# Patient Record
Sex: Female | Born: 1975 | Race: Black or African American | Hispanic: No | Marital: Single | Smoking: Never smoker
Health system: Southern US, Community
[De-identification: ages and names within clinical notes are randomized; demographics above are authoritative.]

## PROBLEM LIST (undated history)

## (undated) DIAGNOSIS — E119 Type 2 diabetes mellitus without complications: Secondary | ICD-10-CM

## (undated) DIAGNOSIS — Z9889 Other specified postprocedural states: Secondary | ICD-10-CM

## (undated) DIAGNOSIS — D649 Anemia, unspecified: Secondary | ICD-10-CM

## (undated) DIAGNOSIS — I10 Essential (primary) hypertension: Secondary | ICD-10-CM

## (undated) DIAGNOSIS — N946 Dysmenorrhea, unspecified: Secondary | ICD-10-CM

## (undated) DIAGNOSIS — N920 Excessive and frequent menstruation with regular cycle: Secondary | ICD-10-CM

## (undated) DIAGNOSIS — D573 Sickle-cell trait: Secondary | ICD-10-CM

## (undated) DIAGNOSIS — E7800 Pure hypercholesterolemia, unspecified: Secondary | ICD-10-CM

## (undated) HISTORY — DX: Dysmenorrhea, unspecified: N94.6

## (undated) HISTORY — DX: Excessive and frequent menstruation with regular cycle: N92.0

## (undated) HISTORY — PX: HERNIA REPAIR: SHX51

## (undated) HISTORY — PX: NECK SURGERY: SHX720

## (undated) HISTORY — DX: Sickle-cell trait: D57.3

## (undated) HISTORY — DX: Anemia, unspecified: D64.9

---

## 2006-04-22 ENCOUNTER — Emergency Department (HOSPITAL_COMMUNITY): Admission: EM | Admit: 2006-04-22 | Discharge: 2006-04-22 | Payer: Self-pay | Admitting: Emergency Medicine

## 2009-10-02 ENCOUNTER — Emergency Department (HOSPITAL_COMMUNITY): Admission: EM | Admit: 2009-10-02 | Discharge: 2009-10-02 | Payer: Self-pay | Admitting: Emergency Medicine

## 2012-09-20 ENCOUNTER — Emergency Department (INDEPENDENT_AMBULATORY_CARE_PROVIDER_SITE_OTHER)
Admission: EM | Admit: 2012-09-20 | Discharge: 2012-09-20 | Disposition: A | Discharge status: Home / Self Care | Payer: PRIVATE HEALTH INSURANCE | Source: Home / Self Care

## 2012-09-20 ENCOUNTER — Encounter (HOSPITAL_COMMUNITY): Payer: Self-pay | Admitting: Emergency Medicine

## 2012-09-20 DIAGNOSIS — L0231 Cutaneous abscess of buttock: Secondary | ICD-10-CM

## 2012-09-20 DIAGNOSIS — L03317 Cellulitis of buttock: Secondary | ICD-10-CM

## 2012-09-20 HISTORY — DX: Other specified postprocedural states: Z98.890

## 2012-09-20 MED ORDER — SULFAMETHOXAZOLE-TRIMETHOPRIM 800-160 MG PO TABS: 1.0000 | ORAL_TABLET | Freq: Two times a day (BID) | ORAL | Status: DC

## 2012-09-20 NOTE — ED Provider Notes (Signed)
Medical screening examination/treatment/procedure(s) were performed by non-physician practitioner and as supervising physician I was immediately available for consultation/collaboration.  Raynald Blend, MD 09/20/12 2227

## 2012-09-20 NOTE — ED Provider Notes (Signed)
History     CSN: [id number]  Arrival date & time 09/20/12  1306   None     Chief Complaint  Patient presents with  . Abscess    (Consider location/radiation/quality/duration/timing/severity/associated sxs/prior treatment) HPI Comments: Presents with a complaint of a sore about on the lower back. 48 hours ago developed a small pimple-like lesion in the upper gluteal cleft. Over the ensuing 48 hours it has increased in size and has become quite tender. There is no evidence of drainage. She had been applying heat and that seemed to help some area denies systemic symptoms  Patient is a 36 y.o. female presenting with abscess. The history is provided by the patient.  Abscess  This is a new problem. The onset was gradual. The problem has been gradually worsening. The problem is moderate. The abscess is characterized by painfulness. Pertinent negatives include no decrease in physical activity, not sleeping less, no fever and no vomiting.    Past Medical History  Diagnosis Date  . H/O neck surgery     Past Surgical History  Procedure Date  . Neck surgery   . Hernia repair     No family history on file.  History  Substance Use Topics  . Smoking status: Never Smoker   . Smokeless tobacco: Not on file  . Alcohol Use: Yes    OB History    Grav Para Term Preterm Abortions TAB SAB Ect Mult Living                  Review of Systems  Constitutional: Negative for fever.  Respiratory: Negative.   Gastrointestinal: Negative.  Negative for vomiting.  Musculoskeletal: Negative.   Skin:       As noted in history of present illness  Neurological: Negative.     Allergies  Review of patient's allergies indicates no known allergies.  Home Medications   Current Outpatient Rx  Name Route Sig Dispense Refill  . SULFAMETHOXAZOLE-TRIMETHOPRIM 800-160 MG PO TABS Oral Take 1 tablet by mouth 2 (two) times daily. 16 tablet 0    BP 131/85  Pulse 73  Temp 98.5 F (36.9 C) (Oral)   Resp 16  SpO2 100%  LMP 09/20/2012  Physical Exam  Constitutional: She is oriented to person, place, and time. She appears well-developed and well-nourished. No distress.  HENT:  Head: Normocephalic and atraumatic.  Eyes: EOM are normal. Pupils are equal, round, and reactive to light.  Neck: Normal range of motion. Neck supple.  Musculoskeletal: Normal range of motion. She exhibits no edema.  Lymphadenopathy:    She has no cervical adenopathy.  Neurological: She is alert and oriented to person, place, and time. No cranial nerve deficit.  Skin: Skin is warm and dry.       There is just a 3 cm slightly raised mounded exquisitely tender lesion in the upper gluteal cleft. There is no pustule no apparent fluctuance and no drainage. No lymphangitis. No redness in the buttocks.    ED Course  Procedures (including critical care time)  Labs Reviewed - No data to display No results found.   1. Cellulitis and abscess of buttock       MDM  Differential includes early abscess formation or possible early pilonidal cyst infection.  Septra DS bid for 8 d   Plenty of water daily Heat to the sore area. Do not sit directly on the lesion.          Hayden Rasmussen, NP 09/20/12 1444  Hayden Rasmussen,  NP 09/20/12 1445

## 2012-09-20 NOTE — ED Notes (Signed)
Reports bump on buttocks per patient.  Noticed Friday and has used warm compresses.  No history of this in the past

## 2013-04-20 ENCOUNTER — Encounter: Payer: Self-pay | Admitting: Certified Nurse Midwife

## 2013-04-21 ENCOUNTER — Encounter: Payer: Self-pay | Admitting: Certified Nurse Midwife

## 2013-04-21 ENCOUNTER — Ambulatory Visit: Payer: Self-pay | Admitting: Certified Nurse Midwife

## 2013-04-21 DIAGNOSIS — Z01419 Encounter for gynecological examination (general) (routine) without abnormal findings: Secondary | ICD-10-CM

## 2013-05-06 ENCOUNTER — Ambulatory Visit (INDEPENDENT_AMBULATORY_CARE_PROVIDER_SITE_OTHER): Payer: 59 | Admitting: Obstetrics and Gynecology

## 2013-05-06 VITALS — BP 128/80

## 2013-05-06 DIAGNOSIS — Z3041 Encounter for surveillance of contraceptive pills: Secondary | ICD-10-CM

## 2013-05-06 NOTE — Progress Notes (Signed)
Patient ID: Tina Vang, female   DOB: July 07, 1976, 37 y.o.   MRN: [id number] Patient presents today for blood pressure check per Ria Comment, NP patient stated she was wanting to switch from the Nuva Ring to an oral contraceptive and last reading was elevated and she was instructed to return for a recheck.    Reading today was 128/80  She will wait for a call for further instruction.

## 2013-05-06 NOTE — Progress Notes (Signed)
Tiffany did you see note for her Nuva Ring? She will need refill on this not OCP

## 2013-05-06 NOTE — Progress Notes (Signed)
Patient wanted to try Nuva ring and was given 3 samples at last visit, (stated doesn't want depo or OCP).  So this was just a BP recheck.  Other note says she wanted to change to a pill.  Is this correct info????

## 2013-05-06 NOTE — Progress Notes (Signed)
BP normal.  Chart routed to Ria Comment for further input.

## 2013-06-29 ENCOUNTER — Telehealth: Payer: Self-pay | Admitting: Nurse Practitioner

## 2013-06-29 NOTE — Telephone Encounter (Signed)
Left message on CB# of need to call office for more information. sue

## 2013-06-29 NOTE — Telephone Encounter (Signed)
Patient wants to start birth control pills but never has taken them before. Was on Nuvaring but wants to try oral contraceptives instead. [employer] [facility] Out[facility] Pharmacy

## 2013-06-30 NOTE — Telephone Encounter (Signed)
Patient called. Stated she reqeust to go on birth control pills. Does not like Nuvaring. Stated did use samples x 2 month and skipped a month and is trying it again and cycle is irregular now and is requesting to go on pills for birth control. Please advise.

## 2013-06-30 NOTE — Telephone Encounter (Signed)
Left message on CB# of need to call office for more information concerning birth control.

## 2013-07-01 MED ORDER — NORETHINDRONE ACET-ETHINYL EST 1-20 MG-MCG PO TABS: 1.0000 | ORAL_TABLET | Freq: Every day | ORAL | Status: DC

## 2013-07-01 NOTE — Telephone Encounter (Signed)
Patient may try OCP. Since history of menorrhagia will try on Loestrin 1/20 for 3 months - no refill until BP check.  Please review start date after menses, side effects and BUM for a month.  Will will need to recheck BP on the third pack of OCP to make sure it did not cause an elevation.  We would have checked on the third month of Nuva Ring but she went off on her own.

## 2013-07-01 NOTE — Telephone Encounter (Signed)
Patient notified of patty's instructions for OCP.  LMP 06-29-13 so instructed to begin OCP pack this Sunday and take one Q day.  For BP check in 3 pack while on active pills.  Patient to call for appointment when starts 3rd pack.  Stressed that BTB is very common side effect first 3 months and she should continue pills daily regardless of bleeding.  Can not rely on this for contraception first cycle and should use BUM. Rx to [facility] pt pharm

## 2014-01-24 ENCOUNTER — Ambulatory Visit: Payer: 59 | Admitting: Nurse Practitioner

## 2014-02-08 ENCOUNTER — Ambulatory Visit: Payer: 59 | Admitting: Nurse Practitioner

## 2014-04-25 ENCOUNTER — Ambulatory Visit: Payer: 59 | Admitting: Nurse Practitioner

## 2014-07-08 ENCOUNTER — Encounter: Payer: Self-pay | Admitting: Nurse Practitioner

## 2014-07-08 ENCOUNTER — Ambulatory Visit (INDEPENDENT_AMBULATORY_CARE_PROVIDER_SITE_OTHER): Payer: 59 | Admitting: Nurse Practitioner

## 2014-07-08 VITALS — BP 132/88 | HR 72 | Ht 64.25 in | Wt 188.0 lb

## 2014-07-08 DIAGNOSIS — Z0000 Encounter for general adult medical examination without abnormal findings: Secondary | ICD-10-CM

## 2014-07-08 DIAGNOSIS — Z01419 Encounter for gynecological examination (general) (routine) without abnormal findings: Secondary | ICD-10-CM

## 2014-07-08 DIAGNOSIS — Z975 Presence of (intrauterine) contraceptive device: Secondary | ICD-10-CM

## 2014-07-08 DIAGNOSIS — Z113 Encounter for screening for infections with a predominantly sexual mode of transmission: Secondary | ICD-10-CM

## 2014-07-08 LAB — HEMOGLOBIN, FINGERSTICK: Hemoglobin, fingerstick: 11.3 g/dL — ABNORMAL LOW (ref 12.0–16.0)

## 2014-07-08 NOTE — Progress Notes (Signed)
Patient ID: Tina Vang, female   DOB: [birth year], 38 y.o.   MRN: [id number] 38 y.o. G1P1001 Single African American Fe here for annual exam.  Had a new partner this past year.  Menses now at 7 days.  Heavy for 3 days.super tampon and pad changing 2 hours.  Having clots.  No cramps.  Does not like the Nuva Ring and OCP caused her 'gagging' and she was noncompliant.  She always uses condoms.  Interested in Mirena IUD.  Last year on the Nuva Ring she did have an elevaetd BP but returned to normal. She had PUS 10/24/2010 and no fibroids were found.     Patient's last menstrual period was 06/26/2014.          Sexually active: Yes.    The current method of family planning is condoms all of the time.    Exercising: No.  The patient does not participate in regular exercise at present. Smoker:  no  Health Maintenance: Pap:  01/21/13, neg, neg HR HPV TDaP:  2013 Labs: HB:  11.3  Urine:  Trace protein    reports that she has never smoked. She has never used smokeless tobacco. She reports that she drinks alcohol. She reports that she does not use illicit drugs.  Past Medical History  Diagnosis Date  . H/O neck surgery   . Dysmenorrhea   . Menorrhagia   . Sickle cell trait   . Anemia     chronic anemia    Past Surgical History  Procedure Laterality Date  . Neck surgery    . Hernia repair      No current outpatient prescriptions on file.   No current facility-administered medications for this visit.    Family History  Problem Relation Age of Onset  . Thyroid disease Mother   . Fibroids Mother   . Heart attack Father [id number]    ETOH and heart disease  . Fibroids Sister   . Thyroid cancer Sister   . Hypertension Brother   . Stroke Paternal Grandmother   . Fibroids Sister   . Cancer Paternal Grandfather     ROS:  Pertinent items are noted in HPI.  Otherwise, a comprehensive ROS was negative.  Exam:   BP 132/88  Pulse 72  Ht 5' 4.25" (1.632 m)  Wt 188 lb (85.276 kg)  BMI  32.02 kg/m2  LMP 06/26/2014 Height: 5' 4.25" (163.2 cm)  Ht Readings from Last 3 Encounters:  07/08/14 5' 4.25" (1.632 m)    General appearance: alert, cooperative and appears stated age Head: Normocephalic, without obvious abnormality, atraumatic Neck: no adenopathy, supple, symmetrical, trachea midline and thyroid normal to inspection and palpation Lungs: clear to auscultation bilaterally Breasts: normal appearance, no masses or tenderness Heart: regular rate and rhythm Abdomen: soft, non-tender; no masses,  no organomegaly Extremities: extremities normal, atraumatic, no cyanosis or edema Skin: Skin color, texture, turgor normal. No rashes or lesions Lymph nodes: Cervical, supraclavicular, and axillary nodes normal. No abnormal inguinal nodes palpated Neurologic: Grossly normal   Pelvic: External genitalia:  no lesions              Urethra:  normal appearing urethra with no masses, tenderness or lesions              Bartholin's and Skene's: normal                 Vagina: normal appearing vagina with normal color and discharge, no lesions  Cervix: anteverted              Pap taken: Yes.   Bimanual Exam:  Uterus:  normal size, contour, position, consistency, mobility, non-tender              Adnexa: no mass, fullness, tenderness               Rectovaginal: Confirms               Anus:  normal sphincter tone, no lesions  A:  Well Woman with normal exam  Menorrhagia with anemia  R/O STD's  Desires Mirena IUD  P:   Reviewed health and wellness pertinent to exam  Pap smear taken today  Follow with other STD's  Order is placed for Mirena IUD - she is aware of need for MD consult visit before IUD is placed.  Counseled on breast self exam, adequate intake of calcium and vitamin D, diet and exercise return annually or prn  An After Visit Summary was printed and given to the patient.

## 2014-07-08 NOTE — Patient Instructions (Addendum)
EXERCISE AND DIET:  We recommended that you start or continue a regular exercise program for good health. Regular exercise means any activity that makes your heart beat faster and makes you sweat.  We recommend exercising at least 30 minutes per day at least 3 days a week, preferably 4 or 5.  We also recommend a diet low in fat and sugar.  Inactivity, poor dietary choices and obesity can cause diabetes, heart attack, stroke, and kidney damage, among others.    ALCOHOL AND SMOKING:  Women should limit their alcohol intake to no more than 7 drinks/beers/glasses of wine (combined, not each!) per week. Moderation of alcohol intake to this level decreases your risk of breast cancer and liver damage. And of course, no recreational drugs are part of a healthy lifestyle.  And absolutely no smoking or even second hand smoke. Most people know smoking can cause heart and lung diseases, but did you know it also contributes to weakening of your bones? Aging of your skin?  Yellowing of your teeth and nails?  CALCIUM AND VITAMIN D:  Adequate intake of calcium and Vitamin D are recommended.  The recommendations for exact amounts of these supplements seem to change often, but generally speaking 600 mg of calcium (either carbonate or citrate) and 800 units of Vitamin D per day seems prudent. Certain women may benefit from higher intake of Vitamin D.  If you are among these women, your doctor will have told you during your visit.    PAP SMEARS:  Pap smears, to check for cervical cancer or precancers,  have traditionally been done yearly, although recent scientific advances have shown that most women can have pap smears less often.  However, every woman still should have a physical exam from her gynecologist every year. It will include a breast check, inspection of the vulva and vagina to check for abnormal growths or skin changes, a visual exam of the cervix, and then an exam to evaluate the size and shape of the uterus and  ovaries.  And after 38 years of age, a rectal exam is indicated to check for rectal cancers. We will also provide age appropriate advice regarding health maintenance, like when you should have certain vaccines, screening for sexually transmitted diseases, bone density testing, colonoscopy, mammograms, etc.   MAMMOGRAMS:  All women over 40 years old should have a yearly mammogram. Many facilities now offer a "3D" mammogram, which may cost around $50 extra out of pocket. If possible,  we recommend you accept the option to have the 3D mammogram performed.  It both reduces the number of women who will be called back for extra views which then turn out to be normal, and it is better than the routine mammogram at detecting truly abnormal areas.    COLONOSCOPY:  Colonoscopy to screen for colon cancer is recommended for all women at age 50.  We know, you hate the idea of the prep.  We agree, BUT, having colon cancer and not knowing it is worse!!  Colon cancer so often starts as a polyp that can be seen and removed at colonscopy, which can quite literally save your life!  And if your first colonoscopy is normal and you have no family history of colon cancer, most women don't have to have it again for 10 years.  Once every ten years, you can do something that may end up saving your life, right?  We will be happy to help you get it scheduled when you are ready.    Be sure to check your insurance coverage so you understand how much it will cost.  It may be covered as a preventative service at no cost, but you should check your particular policy.     Levonorgestrel intrauterine device (IUD) What is this medicine? LEVONORGESTREL IUD (LEE voe nor jes trel) is a contraceptive (birth control) device. The device is placed inside the uterus by a healthcare professional. It is used to prevent pregnancy and can also be used to treat heavy bleeding that occurs during your period. Depending on the device, it can be used for 3 to 5  years. This medicine may be used for other purposes; ask your health care provider or pharmacist if you have questions. COMMON BRAND NAME(S): Mirena, Skyla What should I tell my health care provider before I take this medicine? They need to know if you have any of these conditions: -abnormal Pap smear -cancer of the breast, uterus, or cervix -diabetes -endometritis -genital or pelvic infection now or in the past -have more than one sexual partner or your partner has more than one partner -heart disease -history of an ectopic or tubal pregnancy -immune system problems -IUD in place -liver disease or tumor -problems with blood clots or take blood-thinners -use intravenous drugs -uterus of unusual shape -vaginal bleeding that has not been explained -an unusual or allergic reaction to levonorgestrel, other hormones, silicone, or polyethylene, medicines, foods, dyes, or preservatives -pregnant or trying to get pregnant -breast-feeding How should I use this medicine? This device is placed inside the uterus by a health care professional. Talk to your pediatrician regarding the use of this medicine in children. Special care may be needed. Overdosage: If you think you have taken too much of this medicine contact a poison control center or emergency room at once. NOTE: This medicine is only for you. Do not share this medicine with others. What if I miss a dose? This does not apply. What may interact with this medicine? Do not take this medicine with any of the following medications: -amprenavir -bosentan -fosamprenavir This medicine may also interact with the following medications: -aprepitant -barbiturate medicines for inducing sleep or treating seizures -bexarotene -griseofulvin -medicines to treat seizures like carbamazepine, ethotoin, felbamate, oxcarbazepine, phenytoin, topiramate -modafinil -pioglitazone -rifabutin -rifampin -rifapentine -some medicines to treat HIV  infection like atazanavir, indinavir, lopinavir, nelfinavir, tipranavir, ritonavir -St. John's wort -warfarin This list may not describe all possible interactions. Give your health care provider a list of all the medicines, herbs, non-prescription drugs, or dietary supplements you use. Also tell them if you smoke, drink alcohol, or use illegal drugs. Some items may interact with your medicine. What should I watch for while using this medicine? Visit your doctor or health care professional for regular check ups. See your doctor if you or your partner has sexual contact with others, becomes HIV positive, or gets a sexual transmitted disease. This product does not protect you against HIV infection (AIDS) or other sexually transmitted diseases. You can check the placement of the IUD yourself by reaching up to the top of your vagina with clean fingers to feel the threads. Do not pull on the threads. It is a good habit to check placement after each menstrual period. Call your doctor right away if you feel more of the IUD than just the threads or if you cannot feel the threads at all. The IUD may come out by itself. You may become pregnant if the device comes out. If you notice that the IUD has   come out use a backup birth control method like condoms and call your health care provider. Using tampons will not change the position of the IUD and are okay to use during your period. What side effects may I notice from receiving this medicine? Side effects that you should report to your doctor or health care professional as soon as possible: -allergic reactions like skin rash, itching or hives, swelling of the face, lips, or tongue -fever, flu-like symptoms -genital sores -high blood pressure -no menstrual period for 6 weeks during use -pain, swelling, warmth in the leg -pelvic pain or tenderness -severe or sudden headache -signs of pregnancy -stomach cramping -sudden shortness of breath -trouble with  balance, talking, or walking -unusual vaginal bleeding, discharge -yellowing of the eyes or skin Side effects that usually do not require medical attention (report to your doctor or health care professional if they continue or are bothersome): -acne -breast pain -change in sex drive or performance -changes in weight -cramping, dizziness, or faintness while the device is being inserted -headache -irregular menstrual bleeding within first 3 to 6 months of use -nausea This list may not describe all possible side effects. Call your doctor for medical advice about side effects. You may report side effects to FDA at 1-800-FDA-1088. Where should I keep my medicine? This does not apply. NOTE: This sheet is a summary. It may not cover all possible information. If you have questions about this medicine, talk to your doctor, pharmacist, or health care provider.  2015, Elsevier/Gold Standard. (2012-01-16 13:54:04)  

## 2014-07-09 LAB — STD PANEL
HIV 1&2 Ab, 4th Generation: NONREACTIVE
Hepatitis B Surface Ag: NEGATIVE

## 2014-07-11 NOTE — Progress Notes (Signed)
Note reviewed, agree with plan.  Haakon Titsworth, MD  

## 2014-07-12 LAB — IPS PAP TEST WITH REFLEX TO HPV

## 2014-07-12 LAB — IPS N GONORRHOEA AND CHLAMYDIA BY PCR

## 2014-07-19 ENCOUNTER — Telehealth: Payer: Self-pay | Admitting: Obstetrics and Gynecology

## 2014-07-19 NOTE — Telephone Encounter (Signed)
Patient returning Sabrina's call. °

## 2014-07-19 NOTE — Telephone Encounter (Signed)
Left message for patient to call back. Need to go over IUD benefits °

## 2014-07-19 NOTE — Telephone Encounter (Signed)
Spoke with patient. Advised that per benefits quote received, IUD and insertion is covered at 100%. There will be 0 patient liability. Patient is to call within the first 5 days of her cycle to schedule insertion. °

## 2014-08-02 ENCOUNTER — Telehealth: Payer: Self-pay | Admitting: Nurse Practitioner

## 2014-08-02 NOTE — Telephone Encounter (Signed)
Left message to call Kaitlyn at [phone].  Needs to schedule consult with MD before scheduling insertion.

## 2014-08-02 NOTE — Telephone Encounter (Signed)
Patient is calling to let us know she started her cycle Saturday. Wants to schedule IUD insertion.

## 2014-08-03 NOTE — Telephone Encounter (Signed)
Spoke with patient. Advised patient that she will need MD consult before we can schedule IUD insertion. Offered patient first available appointment with Dr.Lathrop on 8/7 at 12:30pm but patient declines stating "I can't miss work I'll call you back." Patient then hung up phone.  Routing to provider for final review. Patient agreeable to disposition. Will close encounter

## 2014-08-03 NOTE — Telephone Encounter (Signed)
Pt returning call

## 2014-08-03 NOTE — Telephone Encounter (Signed)
[facility]

## 2014-10-31 ENCOUNTER — Encounter: Payer: Self-pay | Admitting: Nurse Practitioner

## 2015-04-03 ENCOUNTER — Telehealth: Payer: Self-pay | Admitting: Nurse Practitioner

## 2015-04-03 NOTE — Telephone Encounter (Signed)
Pt called at 4:20 pm 04/03/15 to cancel appointment for 1030 on 04/04/15. Pt states unable to make appointment tomorrow unless very early. Offered multiple appointments this week. Pt declined and stated she would call back to reschedule.  Pts appointment was scheduled for yeast.

## 2015-04-04 ENCOUNTER — Ambulatory Visit: Payer: 59 | Admitting: Nurse Practitioner

## 2015-04-04 NOTE — Telephone Encounter (Signed)
No I am sure if she has no other times that fit the schedule she will use OTC treatment or call back if that treatment fails.

## 2015-04-04 NOTE — Telephone Encounter (Signed)
Closing encounter

## 2015-04-04 NOTE — Telephone Encounter (Signed)
FYI.  Do you want any additional follow up?

## 2016-02-02 DIAGNOSIS — H5213 Myopia, bilateral: Secondary | ICD-10-CM | POA: Diagnosis not present

## 2016-02-12 ENCOUNTER — Telehealth: Payer: Self-pay | Admitting: Nurse Practitioner

## 2016-02-12 NOTE — Telephone Encounter (Signed)
Patient has been bleeding for 2 weeks. Chart to triage.

## 2016-02-12 NOTE — Telephone Encounter (Signed)
Spoke with patient. Patient states that she has been experiencing light bleeding for 2 weeks. "It is more like a spotting. I thought it was my period, but it has kept going." Denies any heavy bleeding. She is not on any form of birth control. Denies any chance for pregnancy. Denies fatigue, SOB, dizziness, or being light headed. Advised she will need to be seen in the office for further evaluation of her irregular bleeding. She is agreeable. Appointment scheduled for 02/13/2015 at 10:15 am with Ria Comment, FNP. She is agreeable to date and time.  Routing to provider for final review. Patient agreeable to disposition. Will close encounter.

## 2016-02-14 ENCOUNTER — Ambulatory Visit (INDEPENDENT_AMBULATORY_CARE_PROVIDER_SITE_OTHER): Payer: 59 | Admitting: Nurse Practitioner

## 2016-02-14 ENCOUNTER — Encounter: Payer: Self-pay | Admitting: Nurse Practitioner

## 2016-02-14 VITALS — BP 138/84 | HR 72 | Ht 64.25 in | Wt 188.0 lb

## 2016-02-14 DIAGNOSIS — Z113 Encounter for screening for infections with a predominantly sexual mode of transmission: Secondary | ICD-10-CM | POA: Diagnosis not present

## 2016-02-14 DIAGNOSIS — N926 Irregular menstruation, unspecified: Secondary | ICD-10-CM

## 2016-02-14 LAB — POCT URINE PREGNANCY: Preg Test, Ur: NEGATIVE

## 2016-02-14 LAB — HCG, SERUM, QUALITATIVE: Preg, Serum: NEGATIVE

## 2016-02-14 MED ORDER — MEDROXYPROGESTERONE ACETATE 10 MG PO TABS: 10.0000 mg | ORAL_TABLET | Freq: Every day | ORAL | Status: DC

## 2016-02-14 NOTE — Progress Notes (Signed)
Subjective:     Patient ID: Tina Vang, female   DOB: Apr 05, 1976, 40 y.o.   MRN: [id number]  HPI  This 40 yo SAA  Female G1P1001 with complaints of AUB.  She spotted in December for 3 days, then had a regular a week.  That one lasted abput 7 days.   Then normal cycle 1/13/-18 normal.  Then 01/31/16 had another BTB and not stopped.   Second or third day heavier and now heavier like a menses.  No other associated PMS symptoms. currently with breast tenderness.  Home UPT several times is negative. Same partner for 10 months.  Not SA since end of December.   Review of Systems  Constitutional: Negative.   HENT: Negative.   Respiratory: Negative.   Cardiovascular: Negative.   Gastrointestinal: Negative.   Genitourinary: Positive for vaginal bleeding and menstrual problem. Negative for urgency and pelvic pain.  Musculoskeletal: Negative.   Neurological: Negative.   Psychiatric/Behavioral: Negative.        Objective:   Physical Exam  Constitutional: She is oriented to person, place, and time. She appears well-nourished. No distress.  Abdominal: Soft. She exhibits no distension and no mass. There is no tenderness. There is no rebound and no guarding.  Genitourinary:  Moderate vaginal bleeding, cervical os is closed.  Wet prep and GC / Chl is obtained.  Neurological: She is alert and oriented to person, place, and time.  Psychiatric: She has a normal mood and affect. Her behavior is normal. Judgment and thought content normal.       Assessment:     AUB R/O STD's    Plan:     Will get a Stat HCG and call her with results - if negative then will give her Provera 10 mg X 10 days then expect a withdrawal bleed. Per pt OK to leave a message on cell phone - she is going back to work.  She will not get Provera until she is called.

## 2016-02-15 ENCOUNTER — Other Ambulatory Visit: Payer: Self-pay | Admitting: Certified Nurse Midwife

## 2016-02-15 DIAGNOSIS — N760 Acute vaginitis: Secondary | ICD-10-CM

## 2016-02-15 DIAGNOSIS — B9689 Other specified bacterial agents as the cause of diseases classified elsewhere: Secondary | ICD-10-CM

## 2016-02-15 LAB — STD PANEL
HEP B S AG: NEGATIVE
HIV: NONREACTIVE

## 2016-02-15 LAB — WET PREP BY MOLECULAR PROBE
CANDIDA SPECIES: NEGATIVE
GARDNERELLA VAGINALIS: POSITIVE — AB
TRICHOMONAS VAG: NEGATIVE

## 2016-02-15 MED ORDER — METRONIDAZOLE 500 MG PO TABS: 500.0000 mg | ORAL_TABLET | Freq: Two times a day (BID) | ORAL | Status: DC

## 2016-02-15 NOTE — Progress Notes (Signed)
Encounter reviewed by Dr. Fathima Bartl Amundson C. Silva.  

## 2016-02-16 LAB — IPS N GONORRHOEA AND CHLAMYDIA BY PCR

## 2016-02-22 ENCOUNTER — Telehealth: Payer: Self-pay

## 2016-02-22 NOTE — Telephone Encounter (Signed)
Called and left message for patient to call back for results//kg  Patient called back right after I called.  I gave her the result message from [place]. She had no questions or concerns//KG

## 2016-02-22 NOTE — Telephone Encounter (Signed)
-----   Message from Ria Comment, FNP sent at 02/16/2016  5:07 PM EST ----- Please let pt know that GC and Chl is negative.

## 2016-03-07 ENCOUNTER — Encounter: Payer: Self-pay | Admitting: Podiatry

## 2016-03-07 ENCOUNTER — Ambulatory Visit (INDEPENDENT_AMBULATORY_CARE_PROVIDER_SITE_OTHER): Payer: 59 | Admitting: Podiatry

## 2016-03-07 ENCOUNTER — Ambulatory Visit (INDEPENDENT_AMBULATORY_CARE_PROVIDER_SITE_OTHER): Payer: 59

## 2016-03-07 VITALS — BP 146/98 | HR 77 | Resp 16

## 2016-03-07 DIAGNOSIS — M722 Plantar fascial fibromatosis: Secondary | ICD-10-CM

## 2016-03-07 MED ORDER — DICLOFENAC SODIUM 75 MG PO TBEC: 75.0000 mg | DELAYED_RELEASE_TABLET | Freq: Two times a day (BID) | ORAL | Status: DC

## 2016-03-07 NOTE — Progress Notes (Signed)
Subjective:     Patient ID: Tina Vang, female   DOB: 27-May-1976, 40 y.o.   MRN: [id number]  HPI patient states that she stands on her feet all day and her arches bother her a lot especially after she sits at lunch and gets up or after sitting at nighttime or when getting up in the morning   Review of Systems  All other systems reviewed and are negative.      Objective:   Physical Exam  Constitutional: She is oriented to person, place, and time.  Cardiovascular: Intact distal pulses.   Musculoskeletal: Normal range of motion.  Neurological: She is oriented to person, place, and time.  Skin: Skin is warm.  Nursing note and vitals reviewed.  neurovascular status found to be intact with muscle strength adequate range of motion within normal limits. And is noted to have inflammation and discomfort in the mid arch area bilateral with moderate depression of the arch upon weightbearing. Patient does not have heel pain or forefoot pain but it is in the mid arch area bilateral to digital perfusion and well oriented 3.     Assessment:     Probable fasciitis secondary to foot structure with depression of the arch noted bilateral     Plan:     H&P and x-rays reviewed with patient. I have recommended utilization of fascially brace is temporary physical therapy that was outlined for patient supportive shoes and we scanned for custom orthotics to reduce plantar pressure on the feet. Patient will be seen back when those are returned or earlier if any issues were to occur and she is also placed on diclofenac 75 mg twice a day   40 indicated moderate depression of the arch bilateral with no spurring or indications of stress fracture. E long gaited first metatarsal segment noted right

## 2016-03-07 NOTE — Patient Instructions (Signed)

## 2016-03-07 NOTE — Progress Notes (Signed)
   Subjective:    Patient ID: Tina Vang, female    DOB: Jan 28, 1976, 40 y.o.   MRN: [id number]  HPI Pt presents with with bilateral foot pain worse int he mornings and after resting. Lasting 1 year, pain is located across the plantar fascia, no specific area   Review of Systems  All other systems reviewed and are negative.      Objective:   Physical Exam        Assessment & Plan:

## 2016-04-05 ENCOUNTER — Ambulatory Visit: Payer: 59 | Admitting: *Deleted

## 2016-04-05 DIAGNOSIS — M722 Plantar fascial fibromatosis: Secondary | ICD-10-CM

## 2016-04-05 NOTE — Patient Instructions (Signed)

## 2016-04-11 NOTE — Progress Notes (Signed)
Patient ID: Tina Vang, female   DOB: 1976/04/19, 40 y.o.   MRN: [id number] Patient presents for orthotic pick up.  Verbal and written break in and wear instructions given.  Patient will follow up in 4 weeks if symptoms worsen or fail to improve.

## 2016-06-05 ENCOUNTER — Other Ambulatory Visit (HOSPITAL_COMMUNITY)
Admission: RE | Admit: 2016-06-05 | Discharge: 2016-06-05 | Disposition: A | Discharge status: Home / Self Care | Payer: 59 | Source: Ambulatory Visit | Attending: Family Medicine | Admitting: Family Medicine

## 2016-06-05 ENCOUNTER — Other Ambulatory Visit: Payer: Self-pay | Admitting: Family Medicine

## 2016-06-05 DIAGNOSIS — Z1151 Encounter for screening for human papillomavirus (HPV): Secondary | ICD-10-CM | POA: Diagnosis not present

## 2016-06-05 DIAGNOSIS — R7301 Impaired fasting glucose: Secondary | ICD-10-CM | POA: Diagnosis not present

## 2016-06-05 DIAGNOSIS — Z0000 Encounter for general adult medical examination without abnormal findings: Secondary | ICD-10-CM | POA: Diagnosis not present

## 2016-06-05 DIAGNOSIS — Z01411 Encounter for gynecological examination (general) (routine) with abnormal findings: Secondary | ICD-10-CM | POA: Diagnosis not present

## 2016-06-05 DIAGNOSIS — E785 Hyperlipidemia, unspecified: Secondary | ICD-10-CM | POA: Diagnosis not present

## 2016-06-05 DIAGNOSIS — I10 Essential (primary) hypertension: Secondary | ICD-10-CM | POA: Diagnosis not present

## 2016-06-05 DIAGNOSIS — Z124 Encounter for screening for malignant neoplasm of cervix: Secondary | ICD-10-CM | POA: Diagnosis not present

## 2016-06-06 LAB — CYTOLOGY - PAP

## 2016-07-31 ENCOUNTER — Other Ambulatory Visit: Payer: Self-pay | Admitting: Family Medicine

## 2016-07-31 DIAGNOSIS — Z1231 Encounter for screening mammogram for malignant neoplasm of breast: Secondary | ICD-10-CM

## 2016-08-19 ENCOUNTER — Ambulatory Visit: Payer: 59

## 2016-08-22 ENCOUNTER — Other Ambulatory Visit: Payer: Self-pay | Admitting: Family Medicine

## 2016-08-22 ENCOUNTER — Ambulatory Visit
Admission: RE | Admit: 2016-08-22 | Discharge: 2016-08-22 | Disposition: A | Discharge status: Home / Self Care | Payer: 59 | Source: Ambulatory Visit | Attending: Family Medicine | Admitting: Family Medicine

## 2016-08-22 DIAGNOSIS — Z1231 Encounter for screening mammogram for malignant neoplasm of breast: Secondary | ICD-10-CM | POA: Insufficient documentation

## 2017-04-04 DIAGNOSIS — H5213 Myopia, bilateral: Secondary | ICD-10-CM | POA: Diagnosis not present

## 2017-05-09 DIAGNOSIS — Z0189 Encounter for other specified special examinations: Secondary | ICD-10-CM | POA: Diagnosis not present

## 2017-05-09 DIAGNOSIS — Z114 Encounter for screening for human immunodeficiency virus [HIV]: Secondary | ICD-10-CM | POA: Diagnosis not present

## 2017-09-26 ENCOUNTER — Ambulatory Visit: Payer: Self-pay | Admitting: Physician Assistant

## 2017-09-26 ENCOUNTER — Encounter: Payer: Self-pay | Admitting: Physician Assistant

## 2017-09-26 VITALS — BP 160/100 | HR 83 | Temp 97.8°F

## 2017-09-26 DIAGNOSIS — M545 Low back pain, unspecified: Secondary | ICD-10-CM

## 2017-09-26 MED ORDER — KETOROLAC TROMETHAMINE 60 MG/2ML IM SOLN
60.0000 mg | Freq: Once | INTRAMUSCULAR | Status: AC
  Administered 2017-09-26: 60 mg via INTRAMUSCULAR

## 2017-09-26 MED ORDER — METHYLPREDNISOLONE 4 MG PO TBPK: ORAL_TABLET | ORAL | 0 refills | Status: AC

## 2017-09-26 MED ORDER — BACLOFEN 10 MG PO TABS: 10.0000 mg | ORAL_TABLET | Freq: Three times a day (TID) | ORAL | 0 refills | Status: AC

## 2017-09-26 NOTE — Progress Notes (Signed)
S:  C/o low back pain for 1-2 days, no known injury, pain is worse with movement, increased with bending over, denies numbness, tingling, or changes in bowel/urinary habits,  Using otc meds without relief (advil) Remainder ros neg  O:  Vitals wnl, nad, pt does appear uncomfortable;  lungs c t a, cv rrr, spine nontender, muscles in lower back spasmed , decreased rom with bending forward,  no foot drop noted, n/v intact  A: acute back pain, muscle spasms  P: use wet heat followed by ice, stretches, return to clinic if not better in 3 t 5 days, return earlier if worsening, rx meds: medrol dose pack, baclofen, toradol given in clinic, pt to start steroids tomorrow

## 2017-12-15 DIAGNOSIS — N644 Mastodynia: Secondary | ICD-10-CM | POA: Diagnosis not present

## 2017-12-16 ENCOUNTER — Other Ambulatory Visit: Payer: Self-pay | Admitting: Family Medicine

## 2017-12-16 DIAGNOSIS — Z1231 Encounter for screening mammogram for malignant neoplasm of breast: Secondary | ICD-10-CM

## 2017-12-18 ENCOUNTER — Other Ambulatory Visit: Payer: Self-pay | Admitting: Family Medicine

## 2017-12-18 DIAGNOSIS — N644 Mastodynia: Secondary | ICD-10-CM

## 2017-12-24 ENCOUNTER — Ambulatory Visit
Admission: RE | Admit: 2017-12-24 | Discharge: 2017-12-24 | Disposition: A | Discharge status: Home / Self Care | Payer: 59 | Source: Ambulatory Visit | Attending: Family Medicine | Admitting: Family Medicine

## 2017-12-24 DIAGNOSIS — Z1231 Encounter for screening mammogram for malignant neoplasm of breast: Secondary | ICD-10-CM | POA: Insufficient documentation

## 2018-01-23 DIAGNOSIS — L918 Other hypertrophic disorders of the skin: Secondary | ICD-10-CM | POA: Diagnosis not present

## 2018-03-30 DIAGNOSIS — E1165 Type 2 diabetes mellitus with hyperglycemia: Secondary | ICD-10-CM | POA: Diagnosis not present

## 2018-03-30 DIAGNOSIS — H5213 Myopia, bilateral: Secondary | ICD-10-CM | POA: Diagnosis not present

## 2018-03-30 DIAGNOSIS — M47897 Other spondylosis, lumbosacral region: Secondary | ICD-10-CM | POA: Diagnosis not present

## 2018-03-30 DIAGNOSIS — D649 Anemia, unspecified: Secondary | ICD-10-CM | POA: Diagnosis not present

## 2018-03-30 DIAGNOSIS — M545 Low back pain: Secondary | ICD-10-CM | POA: Diagnosis not present

## 2018-03-30 DIAGNOSIS — E010 Iodine-deficiency related diffuse (endemic) goiter: Secondary | ICD-10-CM | POA: Diagnosis not present

## 2018-03-31 DIAGNOSIS — R946 Abnormal results of thyroid function studies: Secondary | ICD-10-CM | POA: Diagnosis not present

## 2018-03-31 DIAGNOSIS — R221 Localized swelling, mass and lump, neck: Secondary | ICD-10-CM | POA: Diagnosis not present

## 2018-07-24 ENCOUNTER — Ambulatory Visit (INDEPENDENT_AMBULATORY_CARE_PROVIDER_SITE_OTHER): Payer: Self-pay | Admitting: Family Medicine

## 2018-07-24 ENCOUNTER — Encounter: Payer: Self-pay | Admitting: Family Medicine

## 2018-07-24 VITALS — BP 150/100 | HR 88 | Temp 97.7°F | Wt 195.0 lb

## 2018-07-24 DIAGNOSIS — J029 Acute pharyngitis, unspecified: Secondary | ICD-10-CM

## 2018-07-24 LAB — POCT RAPID STREP A (OFFICE): RAPID STREP A SCREEN: NEGATIVE

## 2018-07-24 MED ORDER — AMOXICILLIN 875 MG PO TABS: 875.0000 mg | ORAL_TABLET | Freq: Two times a day (BID) | ORAL | 0 refills | Status: AC

## 2018-07-24 NOTE — Patient Instructions (Addendum)
Please follow-up with your PCP regarding your elevated blood pressure and recent episodes of sweating.  For pharyngitis, I am prescribing you Amoxicillin 875 mg twice daily x 7 days. If no improvement, please follow-up here or with your PCP.   Pharyngitis Pharyngitis is a sore throat (pharynx). There is redness, pain, and swelling of your throat. Follow these instructions at home:  Drink enough fluids to keep your pee (urine) clear or pale yellow.  Only take medicine as told by your doctor. ? You may get sick again if you do not take medicine as told. Finish your medicines, even if you start to feel better. ? Do not take aspirin.  Rest.  Rinse your mouth (gargle) with salt water ( tsp of salt per 1 qt of water) every 1-2 hours. This will help the pain.  If you are not at risk for choking, you can suck on hard candy or sore throat lozenges. Contact a doctor if:  You have large, tender lumps on your neck.  You have a rash.  You cough up green, yellow-brown, or bloody spit. Get help right away if:  You have a stiff neck.  You drool or cannot swallow liquids.  You throw up (vomit) or are not able to keep medicine or liquids down.  You have very bad pain that does not go away with medicine.  You have problems breathing (not from a stuffy nose). This information is not intended to replace advice given to you by your health care provider. Make sure you discuss any questions you have with your health care provider. Document Released: 06/03/2008 Document Revised: 05/23/2016 Document Reviewed: 08/23/2013 Elsevier Interactive Patient Education  2017 [employer]

## 2018-07-24 NOTE — Progress Notes (Signed)
Patient ID: Tina Vang, female    DOB: 1976/07/23, 42 y.o.   MRN: [id number]  PCP: Shirlean MylarWebb, Carol, MD  Chief Complaint  Patient presents with  . choice-sorethroat/sweating   Subjective:  HPI Tina Vang is a 42 y.o. female presents for evaluation of sore throat x 1 week.  Pain is localized mostly located to left side of throat. Uncertain of fever. Today, she complains of sweating and headache. Denies post nasal drainageor any other URI symptoms. No known sick contacts. She has not missed work nor has illness impacted activity level. Social History   Socioeconomic History  . Marital status: Single    Spouse name: Not on file  . Number of children: Not on file  . Years of education: Not on file  . Highest education level: Not on file  Occupational History  . Not on file  Social Needs  . Financial resource strain: Not on file  . Food insecurity:    Worry: Not on file    Inability: Not on file  . Transportation needs:    Medical: Not on file    Non-medical: Not on file  Tobacco Use  . Smoking status: Never Smoker  . Smokeless tobacco: Never Used  Substance and Sexual Activity  . Alcohol use: Yes  . Drug use: No  . Sexual activity: Not on file  Lifestyle  . Physical activity:    Days per week: Not on file    Minutes per session: Not on file  . Stress: Not on file  Relationships  . Social connections:    Talks on phone: Not on file    Gets together: Not on file    Attends religious service: Not on file    Active member of club or organization: Not on file    Attends meetings of clubs or organizations: Not on file    Relationship status: Not on file  . Intimate partner violence:    Fear of current or ex partner: Not on file    Emotionally abused: Not on file    Physically abused: Not on file    Forced sexual activity: Not on file  Other Topics Concern  . Not on file  Social History Narrative  . Not on file    Family History  Problem Relation Age of  Onset  . Thyroid disease Mother   . Fibroids Mother   . Heart attack Father [id number]       ETOH and heart disease  . Fibroids Sister   . Thyroid cancer Sister   . Hypertension Brother   . Stroke Paternal Grandmother   . Fibroids Sister   . Cancer Paternal Grandfather   . Breast cancer Cousin 50       mat cousin-2   Review of Systems  Pertinent negatives listed in HPI  No Known Allergies  Prior to Admission medications   Medication Sig Start Date End Date Taking? Authorizing Provider  baclofen (LIORESAL) 10 MG tablet Take 1 tablet (10 mg total) by mouth 3 (three) times daily. Patient not taking: Reported on 07/24/2018 09/26/17   Faythe GheeFisher, Susan W, PA-C  diclofenac (VOLTAREN) 75 MG EC tablet Take 1 tablet (75 mg total) by mouth 2 (two) times daily. Patient not taking: Reported on 09/26/2017 03/07/16   Lenn Sinkegal, Norman S, DPM  methylPREDNISolone (MEDROL DOSEPAK) 4 MG TBPK tablet Take 6 pills on day one then decrease by 1 pill each day Patient not taking: Reported on 07/24/2018 09/26/17   Faythe GheeFisher, Susan W,  PA-C    Past Medical, Surgical Family and Social History reviewed and updated.    Objective:   Today's Vitals   07/24/18 0839  BP: (!) 150/100  Pulse: 88  Temp: 97.7 F (36.5 C)  SpO2: 98%  Weight: 195 lb (88.5 kg)  PainSc: 6     Wt Readings from Last 3 Encounters:  07/24/18 195 lb (88.5 kg)  02/14/16 188 lb (85.3 kg)  07/08/14 188 lb (85.3 kg)   Physical Exam  Constitutional: She is oriented to person, place, and time. She appears well-developed and well-nourished.  HENT:  Mouth/Throat: Posterior oropharyngeal edema and posterior oropharyngeal erythema present. No oropharyngeal exudate or tonsillar abscesses.  Cardiovascular: Normal rate and regular rhythm.  Pulmonary/Chest: Effort normal and breath sounds normal.  Neurological: She is alert and oriented to person, place, and time. Coordination and gait normal.  Skin: Skin is warm and dry.  Psychiatric: She has a normal mood and  affect. Her behavior is normal. Judgment and thought content normal.   Assessment & Plan:  1. Pharyngitis, unspecified etiology, rapid strep negative. Treating for pharyngitis. based upon symptoms and physical exam findings. Strep culture unable in office therefore will treat empirically with broad spectrum antibiotic.  - Meds ordered this encounter  Medications  . amoxicillin (AMOXIL) 875 MG tablet    Sig: Take 1 tablet (875 mg total) by mouth 2 (two) times daily for 7 days.    Dispense:  14 tablet    Refill:  0    Follow-up with PCP regarding blood pressure, further evaluation of temperature intolerance and sweating.     If symptoms worsen or do not improve, return for follow-up, follow-up with PCP, or at the emergency department if severity of symptoms warrant a higher level of care.     Godfrey Pick. Tiburcio Pea, MSN, FNP-C [facility]  [address]  [place], [place] [address] [phone]

## 2020-01-20 ENCOUNTER — Telehealth: Payer: Self-pay | Admitting: General Practice

## 2020-01-20 NOTE — Telephone Encounter (Signed)
Attempted to return call to pt.  Left vm to call [phone], to speak to a nurse re: her questions.

## 2020-01-20 NOTE — Telephone Encounter (Signed)
Best contact [phone] Pt is scheduled to receive 2nd Covid vaccine tomorrow, however right now she has lost her voice. States she does not have a PCP but wants to speak to a nurse

## 2021-12-05 ENCOUNTER — Other Ambulatory Visit: Payer: Self-pay

## 2021-12-05 ENCOUNTER — Emergency Department (HOSPITAL_BASED_OUTPATIENT_CLINIC_OR_DEPARTMENT_OTHER)
Admission: EM | Admit: 2021-12-05 | Discharge: 2021-12-06 | Disposition: A | Discharge status: Home / Self Care | Payer: PRIVATE HEALTH INSURANCE | Attending: Emergency Medicine | Admitting: Emergency Medicine

## 2021-12-05 ENCOUNTER — Emergency Department (HOSPITAL_BASED_OUTPATIENT_CLINIC_OR_DEPARTMENT_OTHER): Payer: PRIVATE HEALTH INSURANCE

## 2021-12-05 ENCOUNTER — Encounter (HOSPITAL_BASED_OUTPATIENT_CLINIC_OR_DEPARTMENT_OTHER): Payer: Self-pay

## 2021-12-05 DIAGNOSIS — I10 Essential (primary) hypertension: Secondary | ICD-10-CM | POA: Diagnosis not present

## 2021-12-05 DIAGNOSIS — E119 Type 2 diabetes mellitus without complications: Secondary | ICD-10-CM | POA: Insufficient documentation

## 2021-12-05 DIAGNOSIS — M79621 Pain in right upper arm: Secondary | ICD-10-CM | POA: Insufficient documentation

## 2021-12-05 DIAGNOSIS — R079 Chest pain, unspecified: Secondary | ICD-10-CM | POA: Diagnosis present

## 2021-12-05 DIAGNOSIS — R072 Precordial pain: Secondary | ICD-10-CM | POA: Insufficient documentation

## 2021-12-05 HISTORY — DX: Type 2 diabetes mellitus without complications: E11.9

## 2021-12-05 HISTORY — DX: Pure hypercholesterolemia, unspecified: E78.00

## 2021-12-05 HISTORY — DX: Essential (primary) hypertension: I10

## 2021-12-05 LAB — BASIC METABOLIC PANEL
Anion gap: 11 (ref 5–15)
BUN: 13 mg/dL (ref 6–20)
CO2: 22 mmol/L (ref 22–32)
Calcium: 9.1 mg/dL (ref 8.9–10.3)
Chloride: 104 mmol/L (ref 98–111)
Creatinine, Ser: 0.93 mg/dL (ref 0.44–1.00)
GFR, Estimated: >60 mL/min (ref >60)
Glucose, Bld: 158 mg/dL — ABNORMAL HIGH (ref 70–99)
Potassium: 3.4 mmol/L — ABNORMAL LOW (ref 3.5–5.1)
Sodium: 137 mmol/L (ref 135–145)

## 2021-12-05 LAB — CBC
HCT: 40.4 % (ref 36.0–46.0)
Hemoglobin: 13.2 g/dL (ref 12.0–15.0)
MCH: 24.3 pg — ABNORMAL LOW (ref 26.0–34.0)
MCHC: 32.7 g/dL (ref 30.0–36.0)
MCV: 74.3 fL — ABNORMAL LOW (ref 80.0–100.0)
Platelets: 316 10*3/uL (ref 150–400)
RBC: 5.44 MIL/uL — ABNORMAL HIGH (ref 3.87–5.11)
RDW: 15.4 % (ref 11.5–15.5)
WBC: 9.9 10*3/uL (ref 4.0–10.5)
nRBC: 0 % (ref 0.0–0.2)

## 2021-12-05 LAB — TROPONIN I (HIGH SENSITIVITY): Troponin I (High Sensitivity): 4 ng/L (ref <18)

## 2021-12-05 NOTE — ED Triage Notes (Signed)
Pt c/o CP started 7pm-denies fever/flu sx-NAD-steady gait

## 2021-12-05 NOTE — ED Notes (Signed)
Patient transported to X-ray 

## 2021-12-06 ENCOUNTER — Encounter (HOSPITAL_BASED_OUTPATIENT_CLINIC_OR_DEPARTMENT_OTHER): Payer: Self-pay | Admitting: Emergency Medicine

## 2021-12-06 LAB — TROPONIN I (HIGH SENSITIVITY): Troponin I (High Sensitivity): 4 ng/L (ref <18)

## 2021-12-06 MED ORDER — IBUPROFEN 400 MG PO TABS
600.0000 mg | ORAL_TABLET | Freq: Once | ORAL | Status: AC
  Administered 2021-12-06: 600 mg via ORAL
  Filled 2021-12-06: qty 1

## 2021-12-06 NOTE — ED Provider Notes (Signed)
[facility] EMERGENCY DEPARTMENT Provider Note   CSN: [id number] Arrival date & time: 12/05/21  2039     History Chief Complaint  Patient presents with   Chest Pain    Tina Vang is a 45 y.o. female.  The history is provided by the patient.  Chest Pain Tina Vang is a 45 y.o. female who presents to the Emergency Department complaining of chest pain.  Pain started at 7pm and is described as a central soreness.  Pain is constant, worse with movement. Has experienced chest pain in the past but nothing like this.  Also has some soreness in the right upper arm.  Pain started at rest.  Has a hx/o HTN, had not taken her blood pressure pill this morning, so took it this evening.  She took her blood pressure this evening and it was 152/95 and then it stated to increase.    No fever, cough, sob.  No AP, N/V, leg swelling/pain.    Has a hx/o HTN, DM, HPL.    No family hx/o heart disease.  No tobacco.  No OCP.      Past Medical History:  Diagnosis Date   Anemia    chronic anemia   Diabetes mellitus without complication (HCC)    Dysmenorrhea    H/O neck surgery    High cholesterol    Hypertension    Menorrhagia    Sickle cell trait (HCC)     There are no problems to display for this patient.   Past Surgical History:  Procedure Laterality Date   HERNIA REPAIR     NECK SURGERY       OB History     Gravida  1   Para  1   Term  1   Preterm      AB      Living  1      SAB      IAB      Ectopic      Multiple      Live Births  1           Family History  Problem Relation Age of Onset   Thyroid disease Mother    Fibroids Mother    Heart attack Father 36       ETOH and heart disease   Fibroids Sister    Thyroid cancer Sister    Hypertension Brother    Stroke Paternal Grandmother    Fibroids Sister    Cancer Paternal Grandfather    Breast cancer Cousin 50       mat cousin-2    Social History   Tobacco Use    Smoking status: Never   Smokeless tobacco: Never  Vaping Use   Vaping Use: Never used  Substance Use Topics   Alcohol use: Yes    Comment: occ   Drug use: No    Home Medications Prior to Admission medications   Medication Sig Start Date End Date Taking? Authorizing Provider  baclofen (LIORESAL) 10 MG tablet Take 1 tablet (10 mg total) by mouth 3 (three) times daily. Patient not taking: Reported on 07/24/2018 09/26/17   Faythe Ghee, PA-C  diclofenac (VOLTAREN) 75 MG EC tablet Take 1 tablet (75 mg total) by mouth 2 (two) times daily. Patient not taking: Reported on 09/26/2017 03/07/16   Lenn Sink, DPM  methylPREDNISolone (MEDROL DOSEPAK) 4 MG TBPK tablet Take 6 pills on day one then decrease by 1 pill each day Patient not  taking: Reported on 07/24/2018 09/26/17   Faythe Ghee, PA-C    Allergies    Patient has no known allergies.  Review of Systems   Review of Systems  Cardiovascular:  Positive for chest pain.  All other systems reviewed and are negative.  Physical Exam Updated Vital Signs BP (!) 163/91 (BP Location: Right Arm)   Pulse 87   Temp 98.5 F (36.9 C) (Oral)   Resp 16   Ht 5\' 2"  (1.575 m)   Wt 80.3 kg   SpO2 95%   BMI 32.37 kg/m   Physical Exam Vitals and nursing note reviewed.  Constitutional:      Appearance: She is well-developed.  HENT:     Head: Normocephalic and atraumatic.  Cardiovascular:     Rate and Rhythm: Normal rate and regular rhythm.     Heart sounds: No murmur heard. Pulmonary:     Effort: Pulmonary effort is normal. No respiratory distress.     Breath sounds: Normal breath sounds.  Chest:     Chest wall: No tenderness.  Abdominal:     Palpations: Abdomen is soft.     Tenderness: There is no abdominal tenderness. There is no guarding or rebound.  Musculoskeletal:        General: No swelling or tenderness.  Skin:    General: Skin is warm and dry.  Neurological:     Mental Status: She is alert and oriented to person, place,  and time.  Psychiatric:        Behavior: Behavior normal.    ED Results / Procedures / Treatments   Labs (all labs ordered are listed, but only abnormal results are displayed) Labs Reviewed  BASIC METABOLIC PANEL - Abnormal; Notable for the following components:      Result Value   Potassium 3.4 (*)    Glucose, Bld 158 (*)    All other components within normal limits  CBC - Abnormal; Notable for the following components:   RBC 5.44 (*)    MCV 74.3 (*)    MCH 24.3 (*)    All other components within normal limits  PREGNANCY, URINE  TROPONIN I (HIGH SENSITIVITY)  TROPONIN I (HIGH SENSITIVITY)    EKG EKG Interpretation  Date/Time:  Wednesday December 05 2021 20:51:26 EST Ventricular Rate:  94 PR Interval:  154 QRS Duration: 74 QT Interval:  370 QTC Calculation: 462 R Axis:   63 Text Interpretation: Normal sinus rhythm Nonspecific T wave abnormality Prolonged QT Abnormal ECG no prior available for comparison Confirmed by 12-14-1983 [phone]) on 12/06/2021 2:05:20 AM  Radiology DG Chest 2 View  Result Date: 12/05/2021 CLINICAL DATA:  Chest pain EXAM: CHEST - 2 VIEW COMPARISON:  None. FINDINGS: The lungs are symmetrically well expanded. A 4 mm nodular opacity is seen within the right upper lung zone, indeterminate. The lungs are otherwise clear. No pneumothorax or pleural effusion. Cardiac size within normal limits. Pulmonary vascularity is normal. No acute bone abnormality. IMPRESSION: No radiographic evidence of acute cardiopulmonary disease. 4 mm opacity within the right upper lung zone. This could be reassessed with a follow-up chest radiograph in 3-6 months or further evaluated with CT imaging if no prior examinations are available to document stability of this lesion. Electronically Signed   By: 14/06/2021 M.D.   On: 12/05/2021 21:16    Procedures Procedures   Medications Ordered in ED Medications  ibuprofen (ADVIL) tablet 600 mg (600 mg Oral Given 12/06/21 0229)     ED Course  I have reviewed the triage vital signs and the nursing notes.  Pertinent labs & imaging results that were available during my care of the patient were reviewed by me and considered in my medical decision making (see chart for details).    MDM Rules/Calculators/A&P                          Patient with history of hypertension, diabetes here for evaluation of chest pain that is reproducible on movement.  EKG with no priors available for comparisons.  Troponins are negative x2.  Presentation is not consistent with ACS, PE, dissection.  Discussed with patient incidental finding of pulmonary nodule.  Discussed recommendation for NSAIDs for pain, PCP follow-up as well as return precautions.  Final Clinical Impression(s) / ED Diagnoses Final diagnoses:  Precordial pain    Rx / DC Orders ED Discharge Orders     None        Tilden Fossa, MD 12/06/21 [phone]

## 2022-07-25 IMAGING — DX DG CHEST 2V
2 series · 2 of 2 positions shown · non-contrast
Comparison: None.

CLINICAL DATA: Chest pain

EXAM:
CHEST - 2 VIEW

[chest pa]
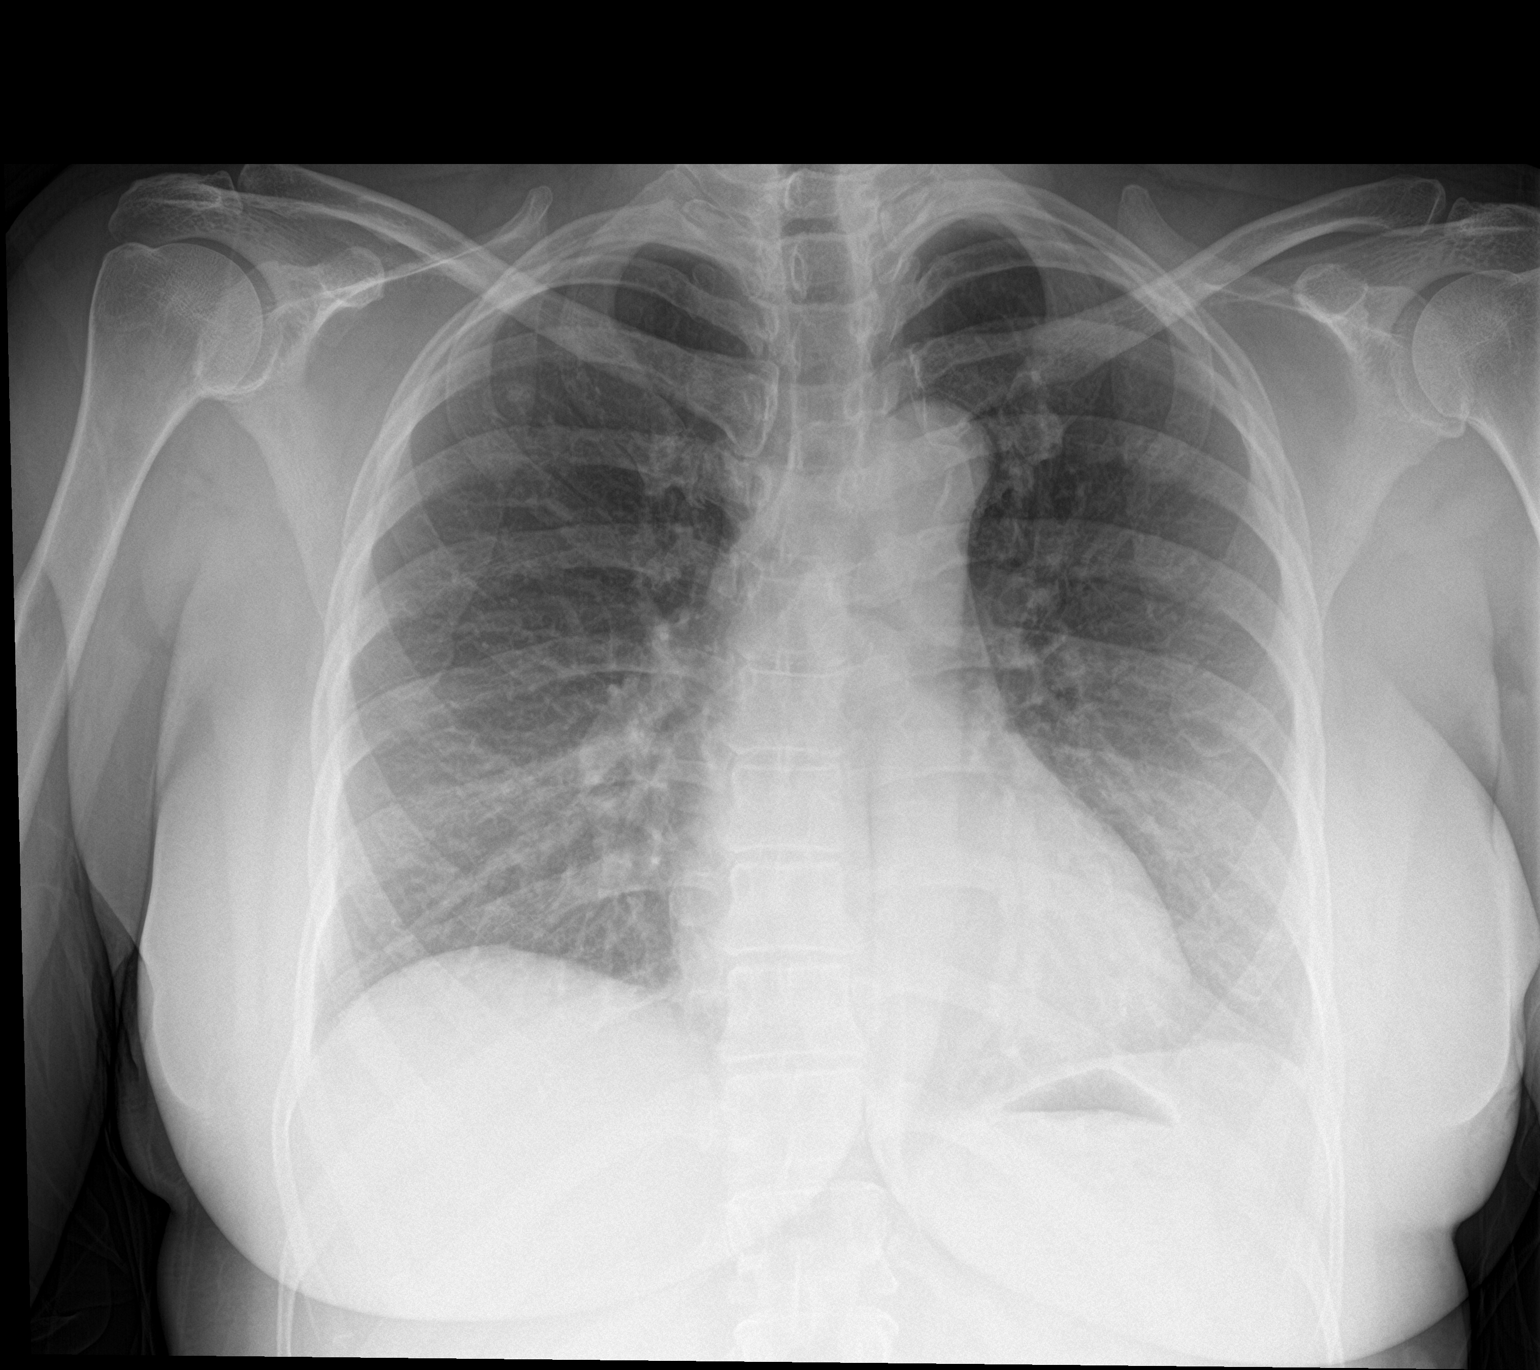

[chest lat]
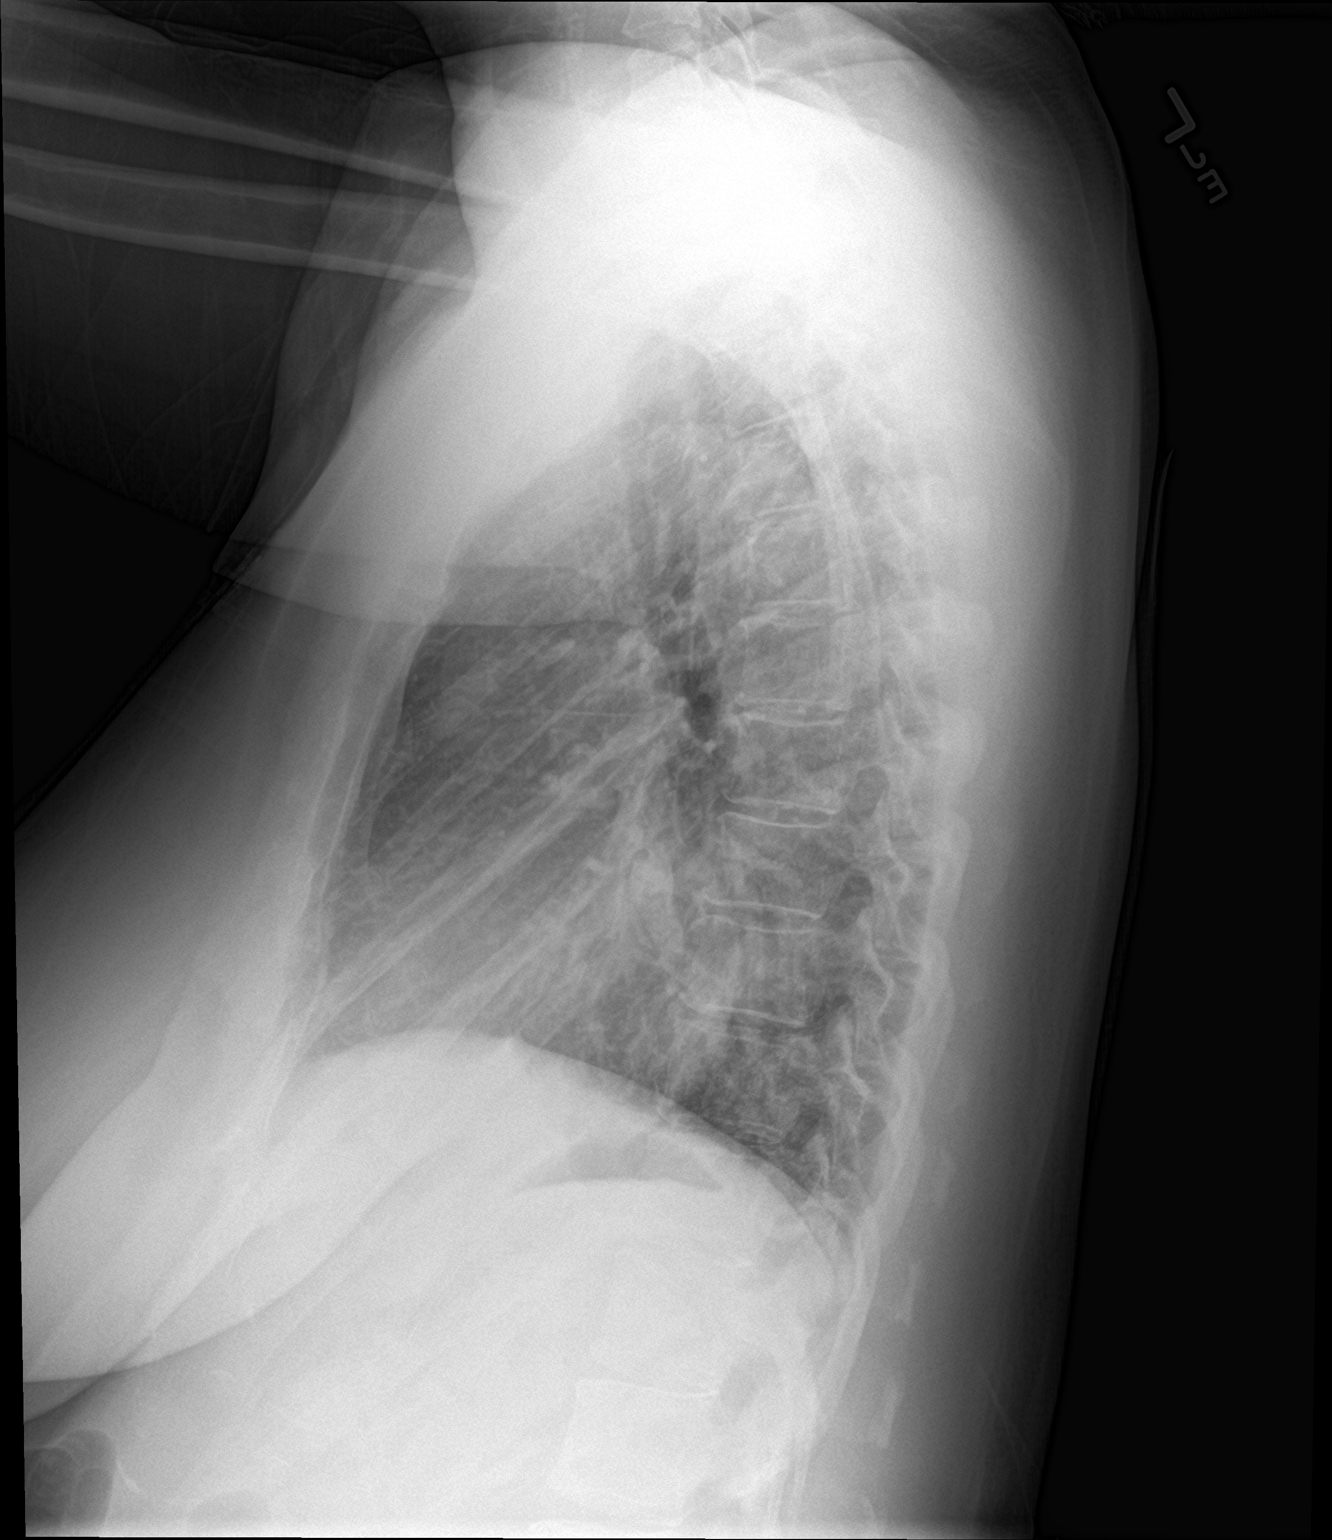

[2 of 2 positions shown; findings below may reference images not displayed]

FINDINGS: The lungs are symmetrically well expanded. A 4 mm nodular opacity is
seen within the right upper lung zone, indeterminate. The lungs are
otherwise clear. No pneumothorax or pleural effusion. Cardiac size
within normal limits. Pulmonary vascularity is normal. No acute bone
abnormality.
IMPRESSION: No radiographic evidence of acute cardiopulmonary disease.

4 mm opacity within the right upper lung zone. This could be
reassessed with a follow-up chest radiograph in 3-6 months or
further evaluated with CT imaging if no prior examinations are
available to document stability of this lesion.

## 2022-10-18 ENCOUNTER — Other Ambulatory Visit (HOSPITAL_BASED_OUTPATIENT_CLINIC_OR_DEPARTMENT_OTHER): Payer: Self-pay | Admitting: "Endocrinology

## 2022-10-18 DIAGNOSIS — E041 Nontoxic single thyroid nodule: Secondary | ICD-10-CM

## 2022-10-24 ENCOUNTER — Ambulatory Visit (HOSPITAL_BASED_OUTPATIENT_CLINIC_OR_DEPARTMENT_OTHER)
Admission: RE | Admit: 2022-10-24 | Discharge: 2022-10-24 | Disposition: A | Discharge status: Home / Self Care | Payer: No Typology Code available for payment source | Source: Ambulatory Visit | Attending: "Endocrinology | Admitting: "Endocrinology

## 2022-10-24 DIAGNOSIS — E041 Nontoxic single thyroid nodule: Secondary | ICD-10-CM | POA: Diagnosis present

## 2022-12-04 ENCOUNTER — Telehealth: Payer: Self-pay | Admitting: Emergency Medicine

## 2022-12-04 ENCOUNTER — Encounter: Payer: Self-pay | Admitting: Emergency Medicine

## 2022-12-04 ENCOUNTER — Other Ambulatory Visit: Payer: Self-pay

## 2022-12-04 ENCOUNTER — Ambulatory Visit
Admission: EM | Admit: 2022-12-04 | Discharge: 2022-12-04 | Disposition: A | Discharge status: Home / Self Care | Payer: No Typology Code available for payment source

## 2022-12-04 DIAGNOSIS — M659 Synovitis and tenosynovitis, unspecified: Secondary | ICD-10-CM | POA: Diagnosis not present

## 2022-12-04 MED ORDER — DICLOFENAC SODIUM 75 MG PO TBEC: 75.0000 mg | DELAYED_RELEASE_TABLET | Freq: Two times a day (BID) | ORAL | 0 refills | Status: AC

## 2022-12-04 NOTE — ED Provider Notes (Signed)
[facility]    CSN: [id number] Arrival date & time: 12/04/22  1328      History   Chief Complaint No chief complaint on file.   HPI Tina Vang is a 46 y.o. female.   Patient presents with concerns of left thumb pain. She first noticed it about two months ago. It comes and goes, usually with certain movements or if she hits it on something. She denies any known injury. 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 She denies numbness/tingling. The pain is mainly on the back of her thumb. She is right-handed. She denies prior similar and hasn't tried anything for it.   The history is provided by the patient.    Past Medical History:  Diagnosis Date   Anemia    chronic anemia   Diabetes mellitus without complication ([place])    Dysmenorrhea    H/O neck surgery    High cholesterol    Hypertension    Menorrhagia    Sickle cell trait ([place])     There are no problems to display for this patient.   Past Surgical History:  Procedure Laterality Date   HERNIA REPAIR     NECK SURGERY      OB History     Gravida  1   Para  1   Term  1   Preterm      AB      Living  1      SAB      IAB      Ectopic      Multiple      Live Births  1            Home Medications    Prior to Admission medications   Medication Sig Start Date End Date Taking? Authorizing Provider  amLODipine (NORVASC) 10 MG tablet Take 10 mg by mouth daily.   Yes [provider]  losartan (COZAAR) 100 MG tablet Take 100 mg by mouth daily.   Yes [provider]  metformin (FORTAMET) 500 MG (OSM) 24 hr tablet Take 500 mg by mouth daily with breakfast.   Yes [provider]  baclofen (LIORESAL) 10 MG tablet Take 1 tablet (10 mg total) by mouth 3 (three) times daily. Patient not taking: Reported on 07/24/2018 09/26/17   Versie Starks, PA-C  diclofenac  (VOLTAREN) 75 MG EC tablet Take 1 tablet (75 mg total) by mouth 2 (two) times daily for 14 days. 12/04/22 12/18/22  Abner Greenspan, Lulubelle Simcoe L, PA  methylPREDNISolone (MEDROL DOSEPAK) 4 MG TBPK tablet Take 6 pills on day one then decrease by 1 pill each day Patient not taking: Reported on 07/24/2018 09/26/17   Versie Starks, PA-C    Family History Family History  Problem Relation Age of Onset   Thyroid disease Mother    Fibroids Mother    Heart attack Father 35       ETOH and heart disease   Fibroids Sister    Thyroid cancer Sister    Hypertension Brother    Stroke Paternal Grandmother    Fibroids Sister    Cancer Paternal Grandfather    Breast cancer Cousin 36       mat cousin-2    Social History Social History   Tobacco Use   Smoking status: Never   Smokeless tobacco: Never  Vaping Use   Vaping Use: Never used  Substance Use Topics   Alcohol use: Yes    Comment: occ   Drug use: No     Allergies   Patient has no known allergies.   Review of Systems Review of Systems  Constitutional:  Negative for fatigue and fever.  Musculoskeletal:  Positive for arthralgias and myalgias. Negative for joint swelling.  Skin:  Negative for color change and rash.  Neurological:  Negative for weakness and numbness.     Physical Exam Triage Vital Signs ED Triage Vitals  Enc Vitals Group     BP 12/04/22 1355 (!) 154/96     Pulse Rate 12/04/22 1355 82     Resp 12/04/22 1355 16     Temp 12/04/22 1355 99.3 F (37.4 C)     Temp Source 12/04/22 1355 Oral     SpO2 12/04/22 1355 97 %     Weight 12/04/22 1356 173 lb (78.5 kg)     Height 12/04/22 1356 5\' 2"  (1.575 m)     Head Circumference --      Peak Flow --      Pain Score 12/04/22 1355 5     Pain Loc --      Pain Edu? --      Excl. in GC? --    No data found.  Updated Vital Signs BP (!) 154/96 (BP Location: Right Arm)   Pulse 82   Temp 99.3 F (37.4 C) (Oral)   Resp 16   Ht 5\' 2"  (1.575 m)   Wt 173 lb (78.5 kg)   SpO2 97%    BMI 31.64 kg/m   Visual Acuity Right Eye Distance:   Left Eye Distance:   Bilateral Distance:    Right Eye Near:   Left Eye Near:    Bilateral Near:     Physical Exam Vitals and nursing note reviewed.  Constitutional:      General: She is not in acute distress. Cardiovascular:     Rate and Rhythm: Normal rate and regular rhythm.     Pulses: Normal pulses.     Heart sounds: Normal heart sounds.  Pulmonary:     Effort: Pulmonary effort is normal.     Breath sounds: Normal breath sounds.  Musculoskeletal:     Left hand: No swelling or tenderness. Normal range of motion. Normal pulse.     Comments: Points to dorsal left 1st MCP into proximal phalanx and IP as area of pain. No point tenderness. Pain reproduced with 14/06/23 maneuver.  Neurological:     Mental Status: She is alert.  Psychiatric:        Mood and Affect: Mood normal.      UC Treatments / Results  Labs (all labs ordered are listed, but only abnormal results are displayed) Labs Reviewed - No data to display  EKG   Radiology No results found.  Procedures Procedures (including critical care time)  Medications Ordered in UC Medications - No data to display  Initial Impression / Assessment and Plan / UC Course  I have reviewed the triage vital signs and the nursing notes.  Pertinent labs & imaging results that were available during my care of the patient were reviewed by me and considered in my medical decision making (see chart for details).     No known injury and given duration, will defer imaging at this time. Sx consistent with tenosynovitis - tx w NSAID and immobilization. Discussed follow-up with specialist if persistent symptoms after conservative tx.   E/M: 1 acute uncomplicated  illness, no data, low risk  Final Clinical Impressions(s) / UC Diagnoses   Final diagnoses:  Tenosynovitis of thumb     Discharge Instructions      Wear brace provided when using hands. Take diclofenac as  prescribed to help with discomfort. If still no improvement in a couple weeks follow-up with ortho or hand specialist for further evaluation.    ED Prescriptions     Medication Sig Dispense Auth. Provider   diclofenac (VOLTAREN) 75 MG EC tablet Take 1 tablet (75 mg total) by mouth 2 (two) times daily for 14 days. 28 tablet Abner Greenspan, Tonna Palazzi L, PA      PDMP not reviewed this encounter.   Delsa Sale, [place] 12/04/22 1436

## 2022-12-04 NOTE — ED Triage Notes (Signed)
Left thumb pain x 2 months

## 2022-12-04 NOTE — Discharge Instructions (Signed)
Wear brace provided when using hands. Take diclofenac as prescribed to help with discomfort. If still no improvement in a couple weeks follow-up with ortho or hand specialist for further evaluation.
